# Patient Record
Sex: Female | Born: 1995 | Race: Black or African American | Hispanic: No | Marital: Single | Smoking: Former smoker
Health system: Southern US, Community
[De-identification: ages and names within clinical notes are randomized; demographics above are authoritative.]

## PROBLEM LIST (undated history)

## (undated) ENCOUNTER — Inpatient Hospital Stay (HOSPITAL_COMMUNITY): Payer: Self-pay

## (undated) DIAGNOSIS — J45909 Unspecified asthma, uncomplicated: Secondary | ICD-10-CM

## (undated) DIAGNOSIS — Z3490 Encounter for supervision of normal pregnancy, unspecified, unspecified trimester: Secondary | ICD-10-CM

## (undated) HISTORY — PX: NO PAST SURGERIES: SHX2092

---
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
## 2015-01-02 ENCOUNTER — Encounter (HOSPITAL_BASED_OUTPATIENT_CLINIC_OR_DEPARTMENT_OTHER): Payer: Self-pay | Admitting: Emergency Medicine

## 2015-01-02 ENCOUNTER — Emergency Department (HOSPITAL_BASED_OUTPATIENT_CLINIC_OR_DEPARTMENT_OTHER)
Admission: EM | Admit: 2015-01-02 | Discharge: 2015-01-03 | Disposition: A | Discharge status: Home / Self Care | Payer: Medicaid Other | Attending: Emergency Medicine | Admitting: Emergency Medicine

## 2015-01-02 DIAGNOSIS — O99513 Diseases of the respiratory system complicating pregnancy, third trimester: Secondary | ICD-10-CM | POA: Diagnosis not present

## 2015-01-02 DIAGNOSIS — Z3A35 35 weeks gestation of pregnancy: Secondary | ICD-10-CM | POA: Diagnosis not present

## 2015-01-02 DIAGNOSIS — Z87891 Personal history of nicotine dependence: Secondary | ICD-10-CM | POA: Insufficient documentation

## 2015-01-02 DIAGNOSIS — J45909 Unspecified asthma, uncomplicated: Secondary | ICD-10-CM | POA: Diagnosis not present

## 2015-01-02 DIAGNOSIS — O4703 False labor before 37 completed weeks of gestation, third trimester: Secondary | ICD-10-CM

## 2015-01-02 DIAGNOSIS — Z79899 Other long term (current) drug therapy: Secondary | ICD-10-CM | POA: Diagnosis not present

## 2015-01-02 LAB — URINALYSIS, ROUTINE W REFLEX MICROSCOPIC
BILIRUBIN URINE: NEGATIVE
Glucose, UA: NEGATIVE mg/dL
HGB URINE DIPSTICK: NEGATIVE
Ketones, ur: 15 mg/dL — AB
Leukocytes, UA: NEGATIVE
Nitrite: NEGATIVE
PH: 6.5 (ref 5.0–8.0)
Protein, ur: NEGATIVE mg/dL
Specific Gravity, Urine: 1.028 (ref 1.005–1.030)
Urobilinogen, UA: 1 mg/dL (ref 0.0–1.0)

## 2015-01-02 MED ORDER — SODIUM CHLORIDE 0.9 % IV BOLUS (SEPSIS)
1000.0000 mL | Freq: Once | INTRAVENOUS | Status: AC
  Administered 2015-01-02: 1000 mL via INTRAVENOUS

## 2015-01-02 NOTE — Discharge Instructions (Signed)
Follow-up with your OB/GYN if you experience further discomfort or other problems.   Preterm Labor Information Preterm labor is when labor starts at less than 37 weeks of pregnancy. The normal length of a pregnancy is 39 to 41 weeks. CAUSES Often, there is no identifiable underlying cause as to why a woman goes into preterm labor. One of the most common known causes of preterm labor is infection. Infections of the uterus, cervix, vagina, amniotic sac, bladder, kidney, or even the lungs (pneumonia) can cause labor to start. Other suspected causes of preterm labor include:   Urogenital infections, such as yeast infections and bacterial vaginosis.   Uterine abnormalities (uterine shape, uterine septum, fibroids, or bleeding from the placenta).   A cervix that has been operated on (it may fail to stay closed).   Malformations in the fetus.   Multiple gestations (twins, triplets, and so on).   Breakage of the amniotic sac.  RISK FACTORS  Having a previous history of preterm labor.   Having premature rupture of membranes (PROM).   Having a placenta that covers the opening of the cervix (placenta previa).   Having a placenta that separates from the uterus (placental abruption).   Having a cervix that is too weak to hold the fetus in the uterus (incompetent cervix).   Having too much fluid in the amniotic sac (polyhydramnios).   Taking illegal drugs or smoking while pregnant.   Not gaining enough weight while pregnant.   Being younger than 64 and older than 19 years old.   Having a low socioeconomic status.   Being African American. SYMPTOMS Signs and symptoms of preterm labor include:   Menstrual-like cramps, abdominal pain, or back pain.  Uterine contractions that are regular, as frequent as six in an hour, regardless of their intensity (may be mild or painful).  Contractions that start on the top of the uterus and spread down to the lower abdomen and back.    A sense of increased pelvic pressure.   A watery or bloody mucus discharge that comes from the vagina.  TREATMENT Depending on the length of the pregnancy and other circumstances, your health care provider may suggest bed rest. If necessary, there are medicines that can be given to stop contractions and to mature the fetal lungs. If labor happens before 34 weeks of pregnancy, a prolonged hospital stay may be recommended. Treatment depends on the condition of both you and the fetus.  WHAT SHOULD YOU DO IF YOU THINK YOU ARE IN PRETERM LABOR? Call your health care provider right away. You will need to go to the hospital to get checked immediately. HOW CAN YOU PREVENT PRETERM LABOR IN FUTURE PREGNANCIES? You should:   Stop smoking if you smoke.  Maintain healthy weight gain and avoid chemicals and drugs that are not necessary.  Be watchful for any type of infection.  Inform your health care provider if you have a known history of preterm labor. Document Released: 07/21/2003 Document Revised: 12/31/2012 Document Reviewed: 06/02/2012 [facility] Patient Information 2015 [place], [place]. This information is not intended to replace advice given to you by your health care provider. Make sure you discuss any questions you have with your health care provider.

## 2015-01-02 NOTE — ED Provider Notes (Signed)
**Note De-Identified Kelly Obfuscation** CSN: [id number]   Arrival date & time 01/02/15 2158  History  This chart was scribed for Geoffery Lyons, MD by Bethel Born, ED Scribe. This patient was seen in room MHT14/MHT14 and the patient's care was started at 10:12 PM.  Chief Complaint  Patient presents with  . pregnancy     HPI The history is provided by the patient. No language interpreter was used.   Kelly Murphy is a 19 y.o. female who is 8.5 months pregnant (G1) presenting to the Emergency Department complaining of increasing pelvic pain with gradual onset 3 hours ago. The pain is described as a near constant pressure. Associated symptoms include lower back pain. She tried to call her OB/GYN at [facility] but was unable to reach the provider.  Past Medical History  Diagnosis Date  . Pregnant   . Asthma     Past Surgical History  Procedure Laterality Date  . No past surgeries      Family History  Problem Relation Age of Onset  . Cancer Mother     Social History  Substance Use Topics  . Smoking status: Former Smoker    Quit date: 04/19/2014  . Smokeless tobacco: Never Used  . Alcohol Use: No     Review of Systems 10 Systems reviewed and all are negative for acute change except as noted in the HPI.  Home Medications   Prior to Admission medications   Medication Sig Start Date End Date Taking? Authorizing Provider  acetaminophen (TYLENOL) 325 MG tablet Take 650 mg by mouth every 6 (six) hours as needed for mild pain.  07/09/14   Historical Provider, MD  Fe Fum-FePoly-FA-Vit C-Vit B3 (INTEGRA F) 125-1 MG CAPS Take 1 tablet by mouth daily. Patient not taking: Reported on 11/23/2014 11/22/14   Marlis Edelson, CNM  Prenatal Vit-Fe Fumarate-FA (PRENATAL MULTIVITAMIN) TABS tablet Take 2 tablets by mouth daily at 12 noon.    Historical Provider, MD    Allergies  Review of patient's allergies indicates no known allergies.  Triage Vitals: BP 130/82 mmHg  Pulse 111  Temp(Src) 98.4 F (36.9 C) (Oral)  Resp 30   Ht  (1.626 m)  Wt 180 lb (81.647 kg)  BMI 30.88 kg/m2  SpO2 98%  LMP 05/07/2014  Physical Exam  Constitutional: She is oriented to person, place, and time. She appears well-developed and well-nourished.  HENT:  Head: Normocephalic.  Eyes: EOM are normal.  Neck: Normal range of motion.  Pulmonary/Chest: Effort normal.  Abdominal: She exhibits no distension.  Fundal height is consistent with stated gestational age.   Genitourinary:  Female chaperone present. Cervix is fingertip and minimally effaced.   Musculoskeletal: Normal range of motion.  Neurological: She is alert and oriented to person, place, and time.  Psychiatric: She has a normal mood and affect.  Nursing note and vitals reviewed.    ED Course  Procedures   DIAGNOSTIC STUDIES: Oxygen Saturation is 98% on RA, normal by my interpretation.    COORDINATION OF CARE: 10:18 PM Discussed treatment plan with pt at bedside and pt agreed to plan.  10:39 PM-Consult complete with Dr. Joanna Puff (Patient's OB/GYN). Patient case explained and discussed. Call ended at 10:43 PM.   Labs Reviewed - No data to display  Imaging Review No results found.  EKG Interpretation None     MDM   Final diagnoses:  None     Patient presents with complaints of lower abdominal pressure. She is 8 months pregnant and is concerned she  may be contracting. On cervical check, her cervix is not dilated or significantly effaced and I have no concern that delivery is imminent. She was placed on the OB monitor which revealed no significant contractions and reassuring cardiac rhythm. I've discussed the situation with Dr. Shawnie Pons who is her OB at [facility]. His recommendations are discharge and return to [facility] if she experiences worsening symptoms. Her urine was clear of protein or infection.   I personally performed the services described in this documentation, which was scribed in my presence. The recorded information has  been reviewed and is accurate.    Geoffery Lyons, MD 01/02/15 2351

## 2015-01-02 NOTE — ED Notes (Signed)
Pt reports it's been 5 minutes since last contraction. Danna Hefty, RN

## 2015-01-02 NOTE — ED Notes (Signed)
Toco strips faxed to [facility] for review.

## 2015-01-02 NOTE — ED Notes (Addendum)
Patient reports contractions which began 3 hours ago.  Reports contractions approximately 2-3 minutes apart.  Reports she has had minimal bleeding and no rupture of membranes.  Reports lower abdominal pain and vaginal pressure.  States she tried to call her Ob/gyn prior to coming to hospital.  States ob/gyn is a [place] [facility]-states she didn't get a call back for physician.

## 2015-01-02 NOTE — ED Notes (Signed)
Paper in machine upside down. Compared tracing to tracing once paper was fixed, fhr remains 150's with moderate variability, + accels, no decels.

## 2015-01-03 NOTE — ED Notes (Signed)
MD aware that hr is 115. No further orders. Pt drinking po fluids without difficulty.

## 2015-04-03 ENCOUNTER — Emergency Department (HOSPITAL_BASED_OUTPATIENT_CLINIC_OR_DEPARTMENT_OTHER)
Admission: EM | Admit: 2015-04-03 | Discharge: 2015-04-03 | Disposition: A | Discharge status: Home / Self Care | Payer: Medicaid Other | Attending: Emergency Medicine | Admitting: Emergency Medicine

## 2015-04-03 ENCOUNTER — Encounter (HOSPITAL_BASED_OUTPATIENT_CLINIC_OR_DEPARTMENT_OTHER): Payer: Self-pay | Admitting: Emergency Medicine

## 2015-04-03 DIAGNOSIS — J45901 Unspecified asthma with (acute) exacerbation: Secondary | ICD-10-CM | POA: Insufficient documentation

## 2015-04-03 DIAGNOSIS — Z79899 Other long term (current) drug therapy: Secondary | ICD-10-CM | POA: Insufficient documentation

## 2015-04-03 DIAGNOSIS — R300 Dysuria: Secondary | ICD-10-CM | POA: Diagnosis present

## 2015-04-03 DIAGNOSIS — Z3202 Encounter for pregnancy test, result negative: Secondary | ICD-10-CM | POA: Diagnosis not present

## 2015-04-03 DIAGNOSIS — N390 Urinary tract infection, site not specified: Secondary | ICD-10-CM | POA: Insufficient documentation

## 2015-04-03 DIAGNOSIS — Z87891 Personal history of nicotine dependence: Secondary | ICD-10-CM | POA: Insufficient documentation

## 2015-04-03 LAB — URINALYSIS, ROUTINE W REFLEX MICROSCOPIC
BILIRUBIN URINE: NEGATIVE
Glucose, UA: NEGATIVE mg/dL
KETONES UR: NEGATIVE mg/dL
NITRITE: NEGATIVE
PROTEIN: 100 mg/dL — AB
Specific Gravity, Urine: 1.022 (ref 1.005–1.030)
pH: 7.5 (ref 5.0–8.0)

## 2015-04-03 LAB — PREGNANCY, URINE: Preg Test, Ur: NEGATIVE

## 2015-04-03 LAB — URINE MICROSCOPIC-ADD ON

## 2015-04-03 MED ORDER — PHENAZOPYRIDINE HCL 95 MG PO TABS: 95.0000 mg | ORAL_TABLET | Freq: Three times a day (TID) | ORAL | Status: AC | PRN

## 2015-04-03 MED ORDER — CEPHALEXIN 500 MG PO CAPS: 500.0000 mg | ORAL_CAPSULE | Freq: Two times a day (BID) | ORAL | Status: AC

## 2015-04-03 NOTE — ED Provider Notes (Signed)
CSN: [id number]     Arrival date & time 04/03/15  2007 History   First MD Initiated Contact with Patient 04/03/15 2055     Chief Complaint  Patient presents with  . Shortness of Breath  . Urinary Tract Infection   HPI  Kelly Murphy is a 19 year old female presenting with dysuria. She states that she was using the restroom approximately 2 hours ago and the pain with urination so severe that she became short of breath and developed chest pain. The dysuria is only present with urination and the pain resolved when she was done using the restroom. She states the chest pain and shortness of breath has also resolved now that the pain is gone. She states the chest pain was generalized and "took my breath away". She is nonspecific about her chest pain. She does have a history of asthma but states she does not think she is having an asthma attack. She did not need to use her rescue inhaler. She denies flank pain, fevers, chills, abdominal pain, hematuria, vaginal discharge or rashes of the groin.  Past Medical History  Diagnosis Date  . Pregnant   . Asthma    Past Surgical History  Procedure Laterality Date  . No past surgeries     Family History  Problem Relation Age of Onset  . Cancer Mother    Social History  Substance Use Topics  . Smoking status: Former Smoker    Quit date: 04/19/2014  . Smokeless tobacco: Never Used  . Alcohol Use: No   OB History    Gravida Para Term Preterm AB TAB SAB Ectopic Multiple Living   1         0     Review of Systems  Respiratory: Positive for shortness of breath.   Cardiovascular: Positive for chest pain.  Gastrointestinal: Negative for nausea, vomiting and abdominal pain.  Genitourinary: Positive for dysuria. Negative for hematuria, flank pain and vaginal discharge.  All other systems reviewed and are negative.     Allergies  Review of patient's allergies indicates no known allergies.  Home Medications   Prior to Admission medications    Medication Sig Start Date End Date Taking? Authorizing Provider  acetaminophen (TYLENOL) 325 MG tablet Take 650 mg by mouth every 6 (six) hours as needed for mild pain.  07/09/14   Historical Provider, MD  cephALEXin (KEFLEX) 500 MG capsule Take 1 capsule (500 mg total) by mouth 2 (two) times daily. 04/03/15   Sharma Lawrance, PA-C  Fe Fum-FePoly-FA-Vit C-Vit B3 (INTEGRA F) 125-1 MG CAPS Take 1 tablet by mouth daily. Patient not taking: Reported on 11/23/2014 11/22/14   Marlis Edelson, CNM  phenazopyridine (PYRIDIUM) 95 MG tablet Take 1 tablet (95 mg total) by mouth 3 (three) times daily as needed for pain. 04/03/15   Rolm Gala Asiah Befort, PA-C  Prenatal Vit-Fe Fumarate-FA (PRENATAL MULTIVITAMIN) TABS tablet Take 2 tablets by mouth daily at 12 noon.    Historical Provider, MD   BP 115/69 mmHg  Pulse 82  Temp(Src) 98.1 F (36.7 C) (Oral)  Resp 20  Ht  (1.626 m)  Wt 160 lb (72.576 kg)  BMI 27.45 kg/m2  SpO2 100%  LMP  (LMP Unknown)  Breastfeeding? Unknown Physical Exam  Constitutional: She appears well-developed and well-nourished. No distress.  HENT:  Head: Normocephalic and atraumatic.  Eyes: Conjunctivae are normal. Right eye exhibits no discharge. Left eye exhibits no discharge. No scleral icterus.  Neck: Normal range of motion.  Cardiovascular: Normal rate, regular  rhythm and normal heart sounds.   Pulmonary/Chest: Effort normal and breath sounds normal. No respiratory distress. She has no wheezes. She has no rales.  Breathing unlabored. Lungs are clear to auscultation bilaterally.  Abdominal: Soft. She exhibits no distension. There is no tenderness. There is no rebound and no guarding.  Musculoskeletal: Normal range of motion.  Moves extremities spontaneously and walks with a steady gait.  Neurological: She is alert. Coordination normal.  Skin: Skin is warm and dry.  Psychiatric: She has a normal mood and affect. Her behavior is normal.  Nursing note and vitals reviewed.   ED  Course  Procedures (including critical care time) Labs Review Labs Reviewed  URINALYSIS, ROUTINE W REFLEX MICROSCOPIC (NOT AT [facility]) - Abnormal; Notable for the following:    APPearance TURBID (*)    Hgb urine dipstick LARGE (*)    Protein, ur 100 (*)    Leukocytes, UA LARGE (*)    All other components within normal limits  URINE MICROSCOPIC-ADD ON - Abnormal; Notable for the following:    Squamous Epithelial / LPF 0-5 (*)    Bacteria, UA MANY (*)    All other components within normal limits  PREGNANCY, URINE    Imaging Review No results found. I have personally reviewed and evaluated these images and lab results as part of my medical decision-making.   EKG Interpretation None      MDM   Final diagnoses:  UTI (lower urinary tract infection)   Patient presenting with severe dysuria. Patient reports that the pain was so severe that she became short of breath and developed chest pain. Pain was only present upon urination and has since resolved. She states that her chest pain and shortness of breath has also resolved. Her complaints of chest pain and shortness of breath are atypical. No concern for cardiac or pulmonary disease at this time. Vital signs stable. Patient is nontoxic-appearing. Abdomen is soft, nontender. No peritoneal signs. UA is positive for UTI. We'll treat with Keflex and Pyridium for dysuria. Return precautions given in discharge paperwork and discussed with pt at bedside. Pt stable for discharge     Alveta HeimlichStevi Nabeel Gladson, PA-C 04/04/15 0038  Jerelyn ScottMartha Linker, MD 04/08/15 [phone]

## 2015-04-03 NOTE — Discharge Instructions (Signed)

## 2015-04-03 NOTE — ED Notes (Signed)
Patient reports that she was urinating when the pain became so bad Patient reports that she reports she became short of breath and then had chest pain. Family member reports that "passed out" x 2 -

## 2016-07-17 NOTE — Telephone Encounter (Signed)
See "Reason for call" 

## 2017-03-12 IMAGING — US US OB LIMITED
1 series · 14 of 22 positions shown · non-contrast
Comparison: none

CLINICAL DATA: Chest pain. Vaginal spotting. Gestational age by
last menstrual period 14 weeks and 5 days.

EXAM:
LIMITED OBSTETRIC ULTRASOUND

[Series 1: us ob limited · 0.22mm/px · 14 of 22 slices shown]
[im 1/22]
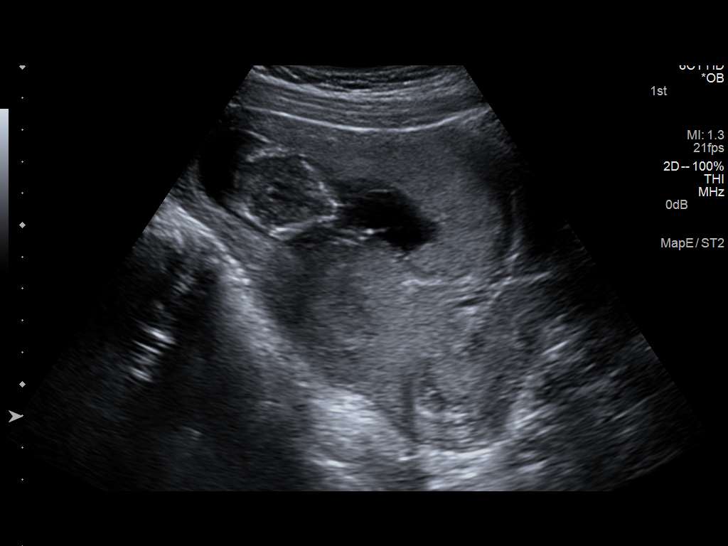
[im 3/22]
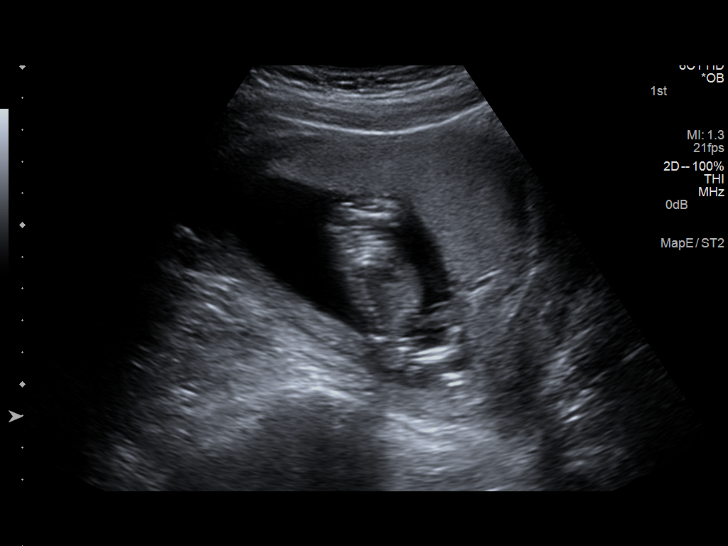
[im 4/22]
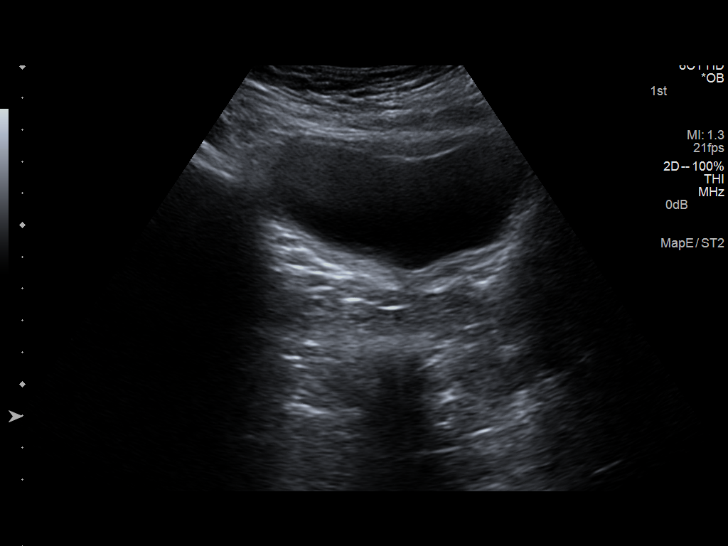
[im 6/22]
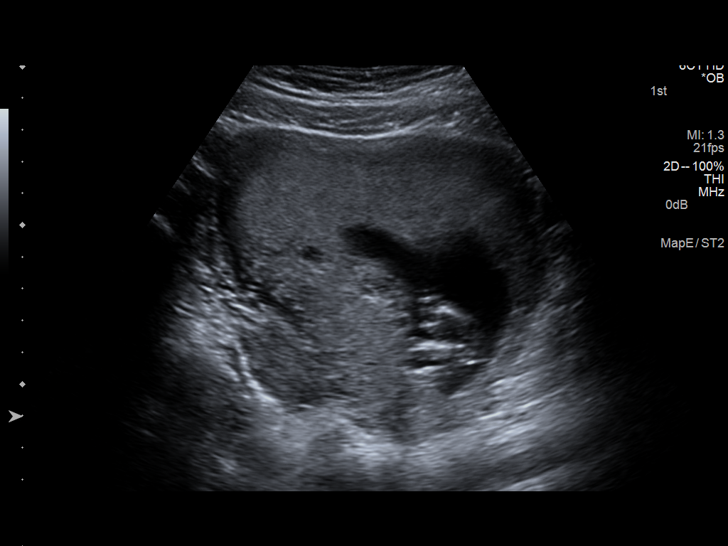
[im 8/22]
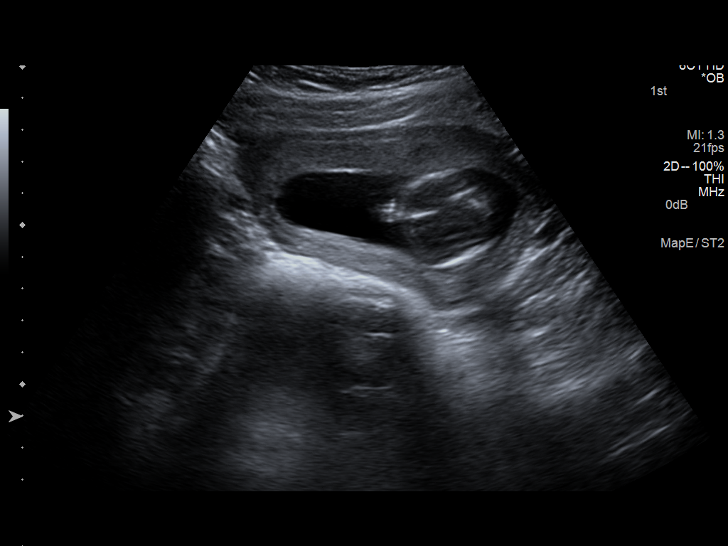
[im 9/22]
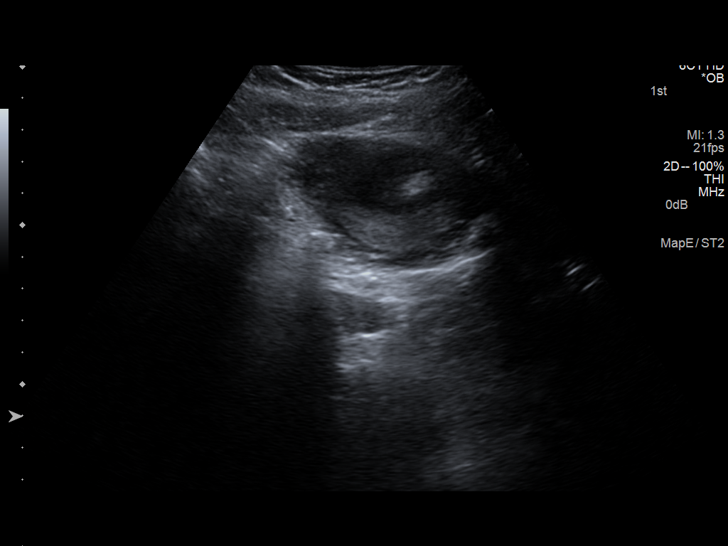
[im 11/22]
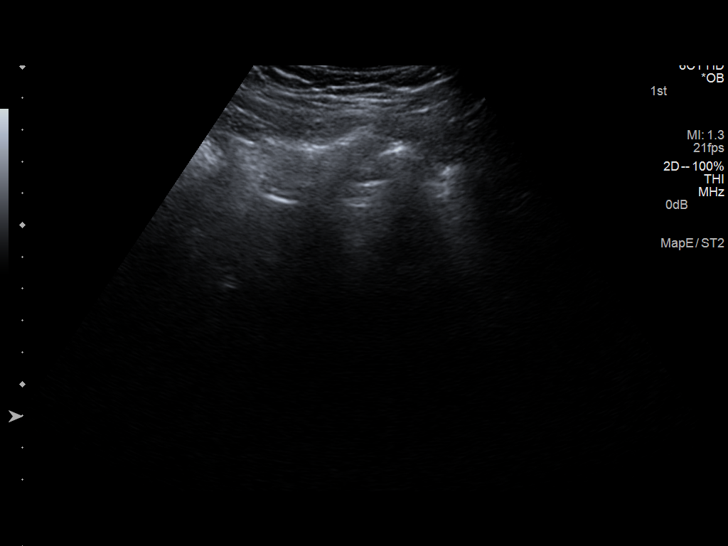
[im 12/22]
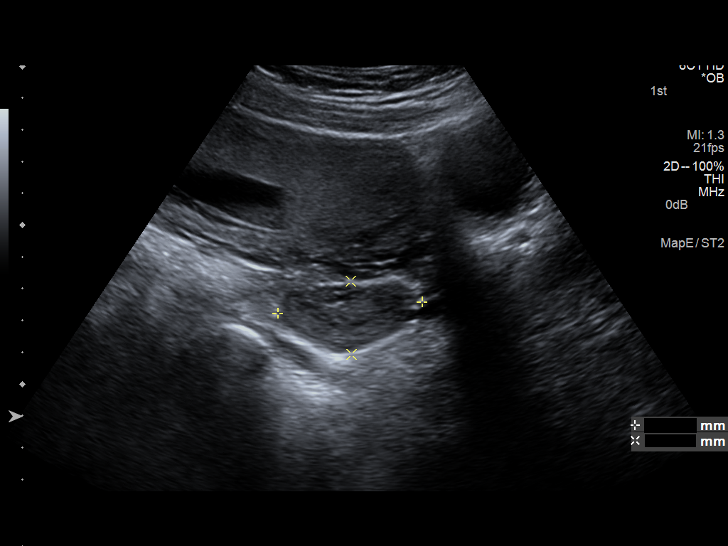
[im 14/22]
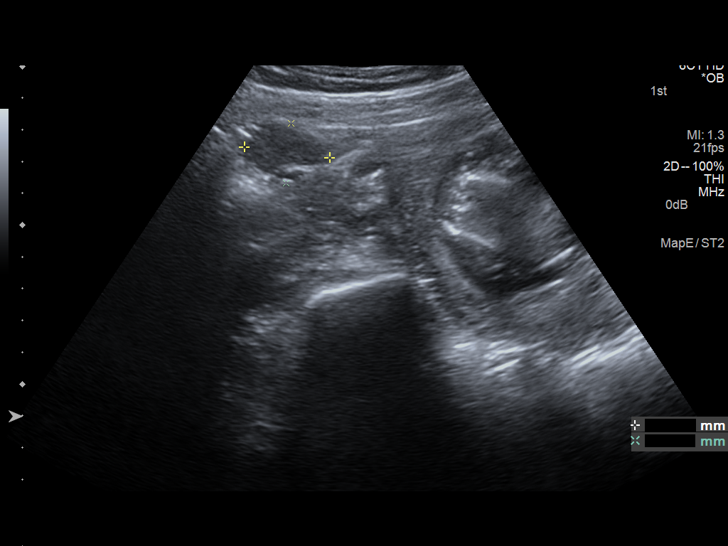
[im 15/22]
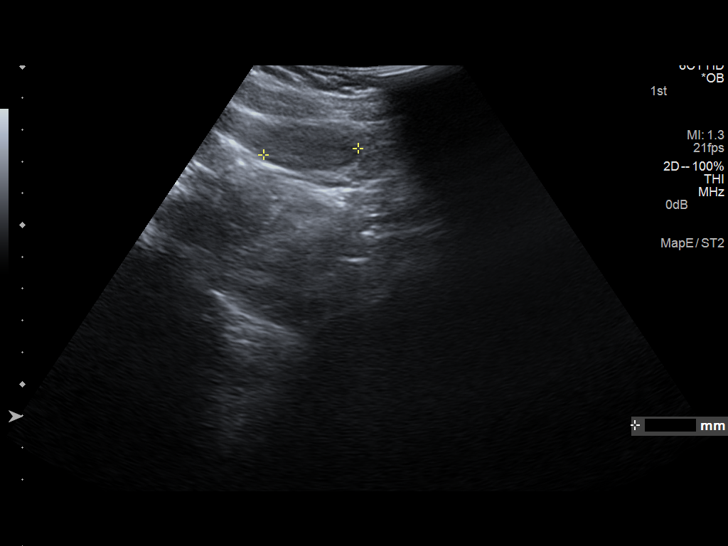
[im 17/22]
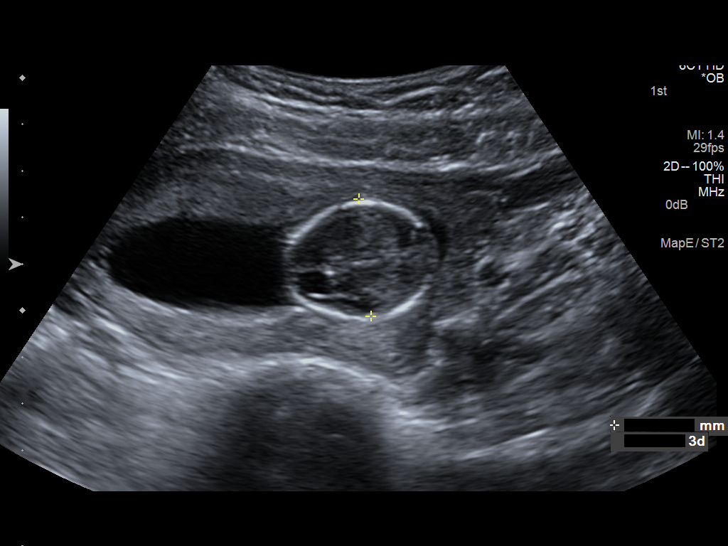
[im 19/22]
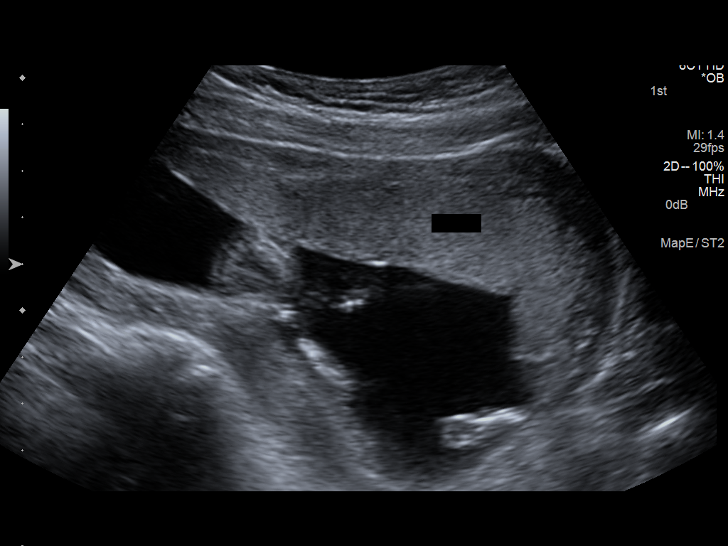
[im 20/22]
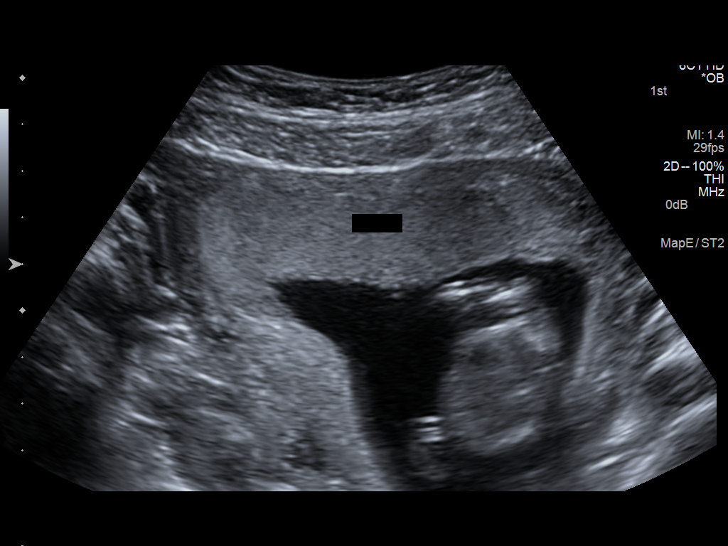
[im 22/22]
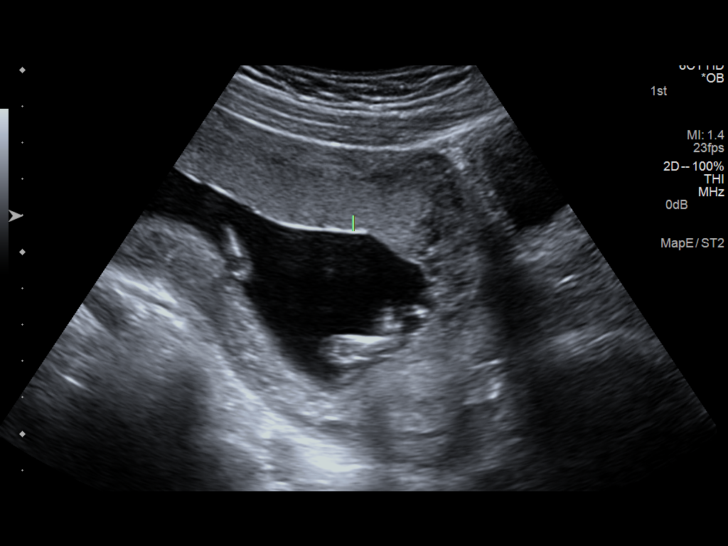

[14 of 22 positions shown; findings below may reference images not displayed]

FINDINGS: Number of Fetuses: 1

Heart Rate:  147 bpm

Movement: Yes

Presentation: Variable

Placental Location: Fundal/anterior

Previa: No

Amniotic Fluid (Subjective):  Within normal limits.

BPD:  25.5cm 14w  3d

MATERNAL FINDINGS:

Cervix:  Appears closed.

Uterus/Adnexae:  No abnormality visualized.
IMPRESSION: Single live intrauterine pregnancy, gestational age by ultrasound 14
weeks and 5 days without immediate complications.

This exam is performed on an emergent basis and does not
comprehensively evaluate fetal size, dating, or anatomy; follow-up
complete OB US should be considered if further fetal assessment is
warranted.

  By: Uta Nik
